# Patient Record
Sex: Male | Born: 1980 | Race: White | Hispanic: No | Marital: Married | Smoking: Never smoker
Health system: Southern US, Community
[De-identification: ages and names within clinical notes are randomized; demographics above are authoritative.]

## PROBLEM LIST (undated history)

## (undated) DIAGNOSIS — K219 Gastro-esophageal reflux disease without esophagitis: Secondary | ICD-10-CM

## (undated) HISTORY — PX: WISDOM TOOTH EXTRACTION: SHX21

---

## 2016-01-08 ENCOUNTER — Encounter (HOSPITAL_COMMUNITY): Payer: Self-pay | Admitting: *Deleted

## 2016-01-08 ENCOUNTER — Emergency Department (HOSPITAL_COMMUNITY): Payer: BLUE CROSS/BLUE SHIELD | Admitting: Anesthesiology

## 2016-01-08 ENCOUNTER — Encounter (HOSPITAL_COMMUNITY)
Admission: EM | Disposition: A | Discharge status: Home / Self Care | Payer: Self-pay | Source: Home / Self Care | Attending: Emergency Medicine

## 2016-01-08 ENCOUNTER — Ambulatory Visit (HOSPITAL_COMMUNITY)
Admission: EM | Admit: 2016-01-08 | Discharge: 2016-01-09 | Disposition: A | Discharge status: Home / Self Care | Payer: BLUE CROSS/BLUE SHIELD | Attending: Orthopedic Surgery | Admitting: Orthopedic Surgery

## 2016-01-08 ENCOUNTER — Emergency Department (HOSPITAL_COMMUNITY): Payer: BLUE CROSS/BLUE SHIELD

## 2016-01-08 DIAGNOSIS — W19XXXA Unspecified fall, initial encounter: Secondary | ICD-10-CM | POA: Insufficient documentation

## 2016-01-08 DIAGNOSIS — S82872C Displaced pilon fracture of left tibia, initial encounter for open fracture type IIIA, IIIB, or IIIC: Secondary | ICD-10-CM

## 2016-01-08 DIAGNOSIS — S82872B Displaced pilon fracture of left tibia, initial encounter for open fracture type I or II: Secondary | ICD-10-CM | POA: Insufficient documentation

## 2016-01-08 DIAGNOSIS — Y92511 Restaurant or cafe as the place of occurrence of the external cause: Secondary | ICD-10-CM | POA: Insufficient documentation

## 2016-01-08 DIAGNOSIS — Y998 Other external cause status: Secondary | ICD-10-CM | POA: Diagnosis not present

## 2016-01-08 DIAGNOSIS — R52 Pain, unspecified: Secondary | ICD-10-CM

## 2016-01-08 DIAGNOSIS — Y9302 Activity, running: Secondary | ICD-10-CM | POA: Diagnosis not present

## 2016-01-08 DIAGNOSIS — S82873A Displaced pilon fracture of unspecified tibia, initial encounter for closed fracture: Secondary | ICD-10-CM

## 2016-01-08 DIAGNOSIS — S82872A Displaced pilon fracture of left tibia, initial encounter for closed fracture: Secondary | ICD-10-CM | POA: Diagnosis present

## 2016-01-08 DIAGNOSIS — S82402B Unspecified fracture of shaft of left fibula, initial encounter for open fracture type I or II: Secondary | ICD-10-CM

## 2016-01-08 HISTORY — PX: ORIF ANKLE FRACTURE: SHX5408

## 2016-01-08 HISTORY — DX: Gastro-esophageal reflux disease without esophagitis: K21.9

## 2016-01-08 HISTORY — PX: ORIF ANKLE FRACTURE: SUR919

## 2016-01-08 LAB — BASIC METABOLIC PANEL
Anion gap: 7 (ref 5–15)
BUN: 16 mg/dL (ref 6–20)
CHLORIDE: 103 mmol/L (ref 101–111)
CO2: 28 mmol/L (ref 22–32)
Calcium: 9.6 mg/dL (ref 8.9–10.3)
Creatinine, Ser: 1.01 mg/dL (ref 0.61–1.24)
GFR calc non Af Amer: >60 mL/min (ref >60)
Glucose, Bld: 99 mg/dL (ref 65–99)
POTASSIUM: 3.8 mmol/L (ref 3.5–5.1)
SODIUM: 138 mmol/L (ref 135–145)

## 2016-01-08 LAB — CBC WITH DIFFERENTIAL/PLATELET
BASOS PCT: 0 %
Basophils Absolute: 0 10*3/uL (ref 0.0–0.1)
EOS ABS: 0.1 10*3/uL (ref 0.0–0.7)
Eosinophils Relative: 2 %
HCT: 42.2 % (ref 39.0–52.0)
HEMOGLOBIN: 14.6 g/dL (ref 13.0–17.0)
LYMPHS ABS: 2.8 10*3/uL (ref 0.7–4.0)
Lymphocytes Relative: 40 %
MCH: 30.1 pg (ref 26.0–34.0)
MCHC: 34.6 g/dL (ref 30.0–36.0)
MCV: 87 fL (ref 78.0–100.0)
Monocytes Absolute: 0.4 10*3/uL (ref 0.1–1.0)
Monocytes Relative: 6 %
NEUTROS PCT: 52 %
Neutro Abs: 3.6 10*3/uL (ref 1.7–7.7)
Platelets: 193 10*3/uL (ref 150–400)
RBC: 4.85 MIL/uL (ref 4.22–5.81)
RDW: 12.4 % (ref 11.5–15.5)
WBC: 6.9 10*3/uL (ref 4.0–10.5)

## 2016-01-08 SURGERY — OPEN REDUCTION INTERNAL FIXATION (ORIF) ANKLE FRACTURE
Anesthesia: General | Site: Ankle | Laterality: Left

## 2016-01-08 MED ORDER — HYDROMORPHONE HCL 1 MG/ML IJ SOLN: 0.2500 mg | INTRAMUSCULAR | Status: DC | PRN

## 2016-01-08 MED ORDER — PROPOFOL 10 MG/ML IV BOLUS
INTRAVENOUS | Status: DC | PRN
  Administered 2016-01-08: 200 mg via INTRAVENOUS

## 2016-01-08 MED ORDER — ASPIRIN EC 325 MG PO TBEC
325.0000 mg | DELAYED_RELEASE_TABLET | Freq: Every day | ORAL | Status: DC
  Administered 2016-01-09: 325 mg via ORAL
  Filled 2016-01-08: qty 1

## 2016-01-08 MED ORDER — METOCLOPRAMIDE HCL 5 MG/ML IJ SOLN: 5.0000 mg | Freq: Three times a day (TID) | INTRAMUSCULAR | Status: DC | PRN

## 2016-01-08 MED ORDER — LACTATED RINGERS IV SOLN
INTRAVENOUS | Status: DC | PRN
  Administered 2016-01-08 (×2): via INTRAVENOUS

## 2016-01-08 MED ORDER — PROPOFOL 10 MG/ML IV BOLUS
INTRAVENOUS | Status: AC
  Filled 2016-01-08: qty 20

## 2016-01-08 MED ORDER — SUCCINYLCHOLINE CHLORIDE 200 MG/10ML IV SOSY
PREFILLED_SYRINGE | INTRAVENOUS | Status: AC
  Filled 2016-01-08: qty 10

## 2016-01-08 MED ORDER — ACETAMINOPHEN 650 MG RE SUPP: 650.0000 mg | Freq: Four times a day (QID) | RECTAL | Status: DC | PRN

## 2016-01-08 MED ORDER — ONDANSETRON HCL 4 MG/2ML IJ SOLN
INTRAMUSCULAR | Status: DC | PRN
  Administered 2016-01-08: 4 mg via INTRAVENOUS

## 2016-01-08 MED ORDER — SODIUM CHLORIDE 0.9 % IR SOLN
Status: DC | PRN
  Administered 2016-01-08: 3000 mL

## 2016-01-08 MED ORDER — METHOCARBAMOL 500 MG PO TABS: 500.0000 mg | ORAL_TABLET | Freq: Four times a day (QID) | ORAL | Status: DC | PRN

## 2016-01-08 MED ORDER — FENTANYL CITRATE (PF) 100 MCG/2ML IJ SOLN
50.0000 ug | Freq: Once | INTRAMUSCULAR | Status: AC
  Administered 2016-01-08: 50 ug via INTRAVENOUS

## 2016-01-08 MED ORDER — ACETAMINOPHEN 325 MG PO TABS
650.0000 mg | ORAL_TABLET | Freq: Four times a day (QID) | ORAL | Status: DC | PRN
  Administered 2016-01-09: 650 mg via ORAL
  Filled 2016-01-08: qty 2

## 2016-01-08 MED ORDER — SUGAMMADEX SODIUM 200 MG/2ML IV SOLN
INTRAVENOUS | Status: AC
  Filled 2016-01-08: qty 2

## 2016-01-08 MED ORDER — HYDROMORPHONE HCL 1 MG/ML IJ SOLN
1.0000 mg | Freq: Once | INTRAMUSCULAR | Status: AC
  Administered 2016-01-08: 1 mg via INTRAVENOUS
  Filled 2016-01-08: qty 1

## 2016-01-08 MED ORDER — ONDANSETRON HCL 4 MG PO TABS: 4.0000 mg | ORAL_TABLET | Freq: Four times a day (QID) | ORAL | Status: DC | PRN

## 2016-01-08 MED ORDER — METOCLOPRAMIDE HCL 5 MG PO TABS: 5.0000 mg | ORAL_TABLET | Freq: Three times a day (TID) | ORAL | Status: DC | PRN

## 2016-01-08 MED ORDER — ONDANSETRON HCL 4 MG/2ML IJ SOLN: 4.0000 mg | Freq: Four times a day (QID) | INTRAMUSCULAR | Status: DC | PRN

## 2016-01-08 MED ORDER — MIDAZOLAM HCL 2 MG/2ML IJ SOLN
INTRAMUSCULAR | Status: AC
  Filled 2016-01-08: qty 2

## 2016-01-08 MED ORDER — KETOROLAC TROMETHAMINE 30 MG/ML IJ SOLN
INTRAMUSCULAR | Status: AC
  Filled 2016-01-08: qty 1

## 2016-01-08 MED ORDER — LIDOCAINE 2% (20 MG/ML) 5 ML SYRINGE
INTRAMUSCULAR | Status: AC
  Filled 2016-01-08: qty 5

## 2016-01-08 MED ORDER — SODIUM CHLORIDE 0.9 % IJ SOLN
INTRAMUSCULAR | Status: AC
Start: 2016-01-08 — End: 2016-01-08
  Filled 2016-01-08: qty 10

## 2016-01-08 MED ORDER — KETOROLAC TROMETHAMINE 15 MG/ML IJ SOLN
15.0000 mg | Freq: Four times a day (QID) | INTRAMUSCULAR | Status: DC
  Administered 2016-01-09 (×3): 15 mg via INTRAVENOUS
  Filled 2016-01-08 (×3): qty 1

## 2016-01-08 MED ORDER — ROCURONIUM BROMIDE 100 MG/10ML IV SOLN
INTRAVENOUS | Status: DC | PRN
  Administered 2016-01-08: 25 mg via INTRAVENOUS

## 2016-01-08 MED ORDER — METHOCARBAMOL 1000 MG/10ML IJ SOLN: 500.0000 mg | Freq: Four times a day (QID) | INTRAVENOUS | Status: DC | PRN

## 2016-01-08 MED ORDER — TETANUS-DIPHTH-ACELL PERTUSSIS 5-2.5-18.5 LF-MCG/0.5 IM SUSP
0.5000 mL | Freq: Once | INTRAMUSCULAR | Status: AC
  Administered 2016-01-08: 0.5 mL via INTRAMUSCULAR
  Filled 2016-01-08: qty 0.5

## 2016-01-08 MED ORDER — FENTANYL CITRATE (PF) 100 MCG/2ML IJ SOLN
INTRAMUSCULAR | Status: AC
  Filled 2016-01-08: qty 2

## 2016-01-08 MED ORDER — SUGAMMADEX SODIUM 200 MG/2ML IV SOLN
INTRAVENOUS | Status: DC | PRN
  Administered 2016-01-08: 200 mg via INTRAVENOUS

## 2016-01-08 MED ORDER — SUFENTANIL CITRATE 50 MCG/ML IV SOLN
INTRAVENOUS | Status: AC
  Filled 2016-01-08: qty 1

## 2016-01-08 MED ORDER — MIDAZOLAM HCL 2 MG/2ML IJ SOLN
INTRAMUSCULAR | Status: DC | PRN
  Administered 2016-01-08: 2 mg via INTRAVENOUS

## 2016-01-08 MED ORDER — LIDOCAINE HCL (CARDIAC) 20 MG/ML IV SOLN
INTRAVENOUS | Status: DC | PRN
  Administered 2016-01-08: 60 mg via INTRATRACHEAL

## 2016-01-08 MED ORDER — KETOROLAC TROMETHAMINE 30 MG/ML IJ SOLN
INTRAMUSCULAR | Status: DC | PRN
  Administered 2016-01-08: 30 mg via INTRAVENOUS

## 2016-01-08 MED ORDER — CEFAZOLIN IN D5W 1 GM/50ML IV SOLN
INTRAVENOUS | Status: AC
  Administered 2016-01-08: 1 g via INTRAVENOUS
  Filled 2016-01-08: qty 50

## 2016-01-08 MED ORDER — TETANUS-DIPHTHERIA TOXOIDS TD 5-2 LFU IM INJ: 0.5000 mL | INJECTION | Freq: Once | INTRAMUSCULAR | Status: DC

## 2016-01-08 MED ORDER — ACETAMINOPHEN 10 MG/ML IV SOLN
INTRAVENOUS | Status: AC
  Filled 2016-01-08: qty 100

## 2016-01-08 MED ORDER — CEFAZOLIN SODIUM-DEXTROSE 2-4 GM/100ML-% IV SOLN
2.0000 g | Freq: Once | INTRAVENOUS | Status: AC
  Administered 2016-01-08: 2 g via INTRAVENOUS
  Filled 2016-01-08: qty 100

## 2016-01-08 MED ORDER — ONDANSETRON HCL 4 MG/2ML IJ SOLN
INTRAMUSCULAR | Status: AC
  Filled 2016-01-08: qty 2

## 2016-01-08 MED ORDER — SUFENTANIL CITRATE 50 MCG/ML IV SOLN
INTRAVENOUS | Status: DC | PRN
  Administered 2016-01-08 (×3): 5 ug via INTRAVENOUS
  Administered 2016-01-08: 15 ug via INTRAVENOUS

## 2016-01-08 MED ORDER — ACETAMINOPHEN 10 MG/ML IV SOLN
INTRAVENOUS | Status: DC | PRN
Start: 2016-01-08 — End: 2016-01-08
  Administered 2016-01-08: 1000 mg via INTRAVENOUS

## 2016-01-08 MED ORDER — OXYCODONE HCL 5 MG PO TABS: 5.0000 mg | ORAL_TABLET | ORAL | Status: DC | PRN

## 2016-01-08 MED ORDER — ROCURONIUM BROMIDE 50 MG/5ML IV SOLN
INTRAVENOUS | Status: AC
Start: 2016-01-08 — End: 2016-01-08
  Filled 2016-01-08: qty 1

## 2016-01-08 MED ORDER — 0.9 % SODIUM CHLORIDE (POUR BTL) OPTIME
TOPICAL | Status: DC | PRN
  Administered 2016-01-08: 1000 mL

## 2016-01-08 MED ORDER — HYDROMORPHONE HCL 1 MG/ML IJ SOLN: 1.0000 mg | INTRAMUSCULAR | Status: DC | PRN

## 2016-01-08 MED ORDER — SUCCINYLCHOLINE CHLORIDE 20 MG/ML IJ SOLN
INTRAMUSCULAR | Status: DC | PRN
  Administered 2016-01-08: 120 mg via INTRAVENOUS

## 2016-01-08 MED ORDER — SODIUM CHLORIDE 0.9 % IV SOLN: INTRAVENOUS | Status: DC

## 2016-01-08 MED ORDER — CEFAZOLIN IN D5W 1 GM/50ML IV SOLN
1.0000 g | Freq: Four times a day (QID) | INTRAVENOUS | Status: AC
  Administered 2016-01-09 (×3): 1 g via INTRAVENOUS
  Filled 2016-01-08 (×3): qty 50

## 2016-01-08 SURGICAL SUPPLY — 52 items
BANDAGE ESMARK 6X9 LF (GAUZE/BANDAGES/DRESSINGS) ×1 IMPLANT
BIT DRILL LCP QC 2X140 (BIT) ×3 IMPLANT
BNDG COHESIVE 4X5 TAN STRL (GAUZE/BANDAGES/DRESSINGS) ×3 IMPLANT
BNDG ESMARK 6X9 LF (GAUZE/BANDAGES/DRESSINGS) ×3
BNDG GAUZE ELAST 4 BULKY (GAUZE/BANDAGES/DRESSINGS) IMPLANT
CANISTER WOUND CARE 500ML ATS (WOUND CARE) ×3 IMPLANT
COVER SURGICAL LIGHT HANDLE (MISCELLANEOUS) ×6 IMPLANT
CUFF TOURNIQUET SINGLE 34IN LL (TOURNIQUET CUFF) IMPLANT
CUFF TOURNIQUET SINGLE 44IN (TOURNIQUET CUFF) IMPLANT
DRAPE INCISE IOBAN 66X45 STRL (DRAPES) ×3 IMPLANT
DRAPE OEC MINIVIEW 54X84 (DRAPES) ×3 IMPLANT
DRAPE PROXIMA HALF (DRAPES) ×3 IMPLANT
DRAPE U-SHAPE 47X51 STRL (DRAPES) ×3 IMPLANT
DRSG ADAPTIC 3X8 NADH LF (GAUZE/BANDAGES/DRESSINGS) ×3 IMPLANT
DRSG PAD ABDOMINAL 8X10 ST (GAUZE/BANDAGES/DRESSINGS) ×3 IMPLANT
DURAPREP 26ML APPLICATOR (WOUND CARE) ×3 IMPLANT
ELECT REM PT RETURN 9FT ADLT (ELECTROSURGICAL) ×3
ELECTRODE REM PT RTRN 9FT ADLT (ELECTROSURGICAL) ×1 IMPLANT
GAUZE SPONGE 4X4 12PLY STRL (GAUZE/BANDAGES/DRESSINGS) ×3 IMPLANT
GLOVE BIOGEL PI IND STRL 9 (GLOVE) ×1 IMPLANT
GLOVE BIOGEL PI INDICATOR 9 (GLOVE) ×2
GLOVE SURG ORTHO 9.0 STRL STRW (GLOVE) ×3 IMPLANT
GOWN STRL REUS W/ TWL XL LVL3 (GOWN DISPOSABLE) ×3 IMPLANT
GOWN STRL REUS W/TWL XL LVL3 (GOWN DISPOSABLE) ×6
HANDPIECE INTERPULSE COAX TIP (DISPOSABLE) ×2
KIT BASIN OR (CUSTOM PROCEDURE TRAY) ×3 IMPLANT
KIT ROOM TURNOVER OR (KITS) ×3 IMPLANT
MANIFOLD NEPTUNE II (INSTRUMENTS) ×3 IMPLANT
NS IRRIG 1000ML POUR BTL (IV SOLUTION) ×3 IMPLANT
PACK ORTHO EXTREMITY (CUSTOM PROCEDURE TRAY) ×3 IMPLANT
PAD ARMBOARD 7.5X6 YLW CONV (MISCELLANEOUS) ×6 IMPLANT
PLATE DIST TIBIA 6H 2.7/3.5MM (Plate) ×3 IMPLANT
PREVENA INCISION MGT 90 150 (MISCELLANEOUS) ×3 IMPLANT
SCREW CORTEX LOW PRO 3.5X26 (Screw) ×3 IMPLANT
SCREW CORTEX LOW PRO 3.5X28 (Screw) ×6 IMPLANT
SCREW CORTEX LP 3.5X34MM (Screw) ×3 IMPLANT
SCREW LOCKING 2.7X44MM VA (Screw) ×6 IMPLANT
SCREW LOCKING VA 2.7X34MM (Screw) ×3 IMPLANT
SCREW METAPHYSEAL 2.7X36 (Screw) ×3 IMPLANT
SET HNDPC FAN SPRY TIP SCT (DISPOSABLE) ×1 IMPLANT
SPONGE LAP 18X18 X RAY DECT (DISPOSABLE) ×3 IMPLANT
STAPLER VISISTAT 35W (STAPLE) IMPLANT
SUCTION FRAZIER HANDLE 10FR (MISCELLANEOUS) ×2
SUCTION TUBE FRAZIER 10FR DISP (MISCELLANEOUS) ×1 IMPLANT
SUT ETHILON 2 0 PSLX (SUTURE) ×6 IMPLANT
SUT VIC AB 2-0 CT1 27 (SUTURE)
SUT VIC AB 2-0 CT1 TAPERPNT 27 (SUTURE) IMPLANT
TOWEL OR 17X24 6PK STRL BLUE (TOWEL DISPOSABLE) ×3 IMPLANT
TOWEL OR 17X26 10 PK STRL BLUE (TOWEL DISPOSABLE) ×3 IMPLANT
TUBE CONNECTING 12'X1/4 (SUCTIONS) ×1
TUBE CONNECTING 12X1/4 (SUCTIONS) ×2 IMPLANT
YANKAUER SUCT BULB TIP NO VENT (SUCTIONS) ×3 IMPLANT

## 2016-01-08 NOTE — Op Note (Signed)
01/08/2016  9:04 PM  PATIENT:  Shane Sweeney    PRE-OPERATIVE DIAGNOSIS:  Open left  pilon fracture with associated fibular fracture  POST-OPERATIVE DIAGNOSIS:  Same  PROCEDURE:  OPEN REDUCTION INTERNAL FIXATION (ORIF) ANKLE FRACTURE Irrigation and debridement of skin soft tissue and bone with removal of loose bone fragments. Application of Prevena wound VAC.  SURGEON:  Nadara MustardUDA,MARCUS V, MD  PHYSICIAN ASSISTANT:None ANESTHESIA:   General  PREOPERATIVE INDICATIONS:  Shane Sweeney is a  35 y.o. male with a diagnosis of Open left  pilon fracture with associated fibular fracture who failed conservative measures and elected for surgical management.    The risks benefits and alternatives were discussed with the patient preoperatively including but not limited to the risks of infection, bleeding, nerve injury, cardiopulmonary complications, the need for revision surgery, among others, and the patient was willing to proceed.  OPERATIVE IMPLANTS: 6 hole anterior lateral Synthes plate  OPERATIVE FINDINGS: Comminution of the fibular fracture  OPERATIVE PROCEDURE: Patient was brought to the operating room and underwent a general anesthetic. After adequate levels anesthesia obtained patient's left lower extremity was prepped using DuraPrep draped into a sterile field a timeout was called. An anterior lateral incision was made and blunt dissection was carried down to the fascia of the superficial peroneal nerve was identified and retracted laterally. The extensor muscles were also retracted laterally. The fracture site was debrided of skin and soft tissue the fibula had significant comminution and this was irrigated with pulsatile lavage for 3 L and loose bony fragments were removed. The fibula was in essentially 4 large fragments the proximal medial medial fragment was reduced and stabilized with a clamp on the distal 2 fragments were reduced and an anterior lateral plate was placed provisionally a cart K  wire was used to stabilize the alignment and C-arm fluoroscopy verified alignment both AP and lateral planes with restoration of congruence of the joint. Compression screws were placed proximally 3 and compression followed by locking screws were placed 4 distally. C-arm fluoroscopy verified alignment one of the compression screws was placed obliquely through the medial fragment of the shaft to stabilize this fragment. The wound was irrigated with pulsatile lavage. There was significant destruction of the extensor muscles and the nonviable muscle tissue was excised. After reduction of the mortise was congruent in both AP lateral and oblique planes. The fibula lined up anatomically after reduction of the tibia and with the comminution and the open wound over the fibula was elected not to put a plate that this would not increase the risk for infection and would not stabilize the construct further. The incision was closed using 2-0 nylon. A Prevena wound VAC was applied this had a good suction fit patient was extubated taken to the PACU in stable condition.

## 2016-01-08 NOTE — ED Notes (Signed)
Pt arrives POV with wife.  Helped him out of car.  Obvious deformity of LLE, open ankle fracture, pt is bleeding.  Taken to room by NT

## 2016-01-08 NOTE — ED Notes (Signed)
Pt states he jumped over a bush trying to return a purse and states he broke his right ankle.

## 2016-01-08 NOTE — H&P (Signed)
Shane Sweeney is an 35 y.o. male.   Chief Complaint: Open left ankle pilon fracture HPI: Shane Sweeney is a 35 year old gentleman who was at [facility] somebody left an item behind he was running and jumped over ablation to try to get to the individual and he landed sustaining a intra-articular fracture of the distal tibia and distal fibula open fracture. Shane Sweeney denies any other trauma denies any head injury.  History reviewed. No pertinent past medical history.  Past Surgical History  Procedure Laterality Date  . Wisdom tooth extraction      No family history on file. Social History:  reports that he has never smoked. He does not have any smokeless tobacco history on file. He reports that he does not drink alcohol or use illicit drugs.  Allergies: No Known Allergies   (Not in a hospital admission)  Results for orders placed or performed during the hospital encounter of 01/08/16 (from the past 48 hour(s))  CBC with Differential     Status: None   Collection Time: 01/08/16  4:00 PM  Result Value Ref Range   WBC 6.9 4.0 - 10.5 K/uL   RBC 4.85 4.22 - 5.81 MIL/uL   Hemoglobin 14.6 13.0 - 17.0 g/dL   HCT 42.2 39.0 - 52.0 %   MCV 87.0 78.0 - 100.0 fL   MCH 30.1 26.0 - 34.0 pg   MCHC 34.6 30.0 - 36.0 g/dL   RDW 12.4 11.5 - 15.5 %   Platelets 193 150 - 400 K/uL   Neutrophils Relative % 52 %   Neutro Abs 3.6 1.7 - 7.7 K/uL   Lymphocytes Relative 40 %   Lymphs Abs 2.8 0.7 - 4.0 K/uL   Monocytes Relative 6 %   Monocytes Absolute 0.4 0.1 - 1.0 K/uL   Eosinophils Relative 2 %   Eosinophils Absolute 0.1 0.0 - 0.7 K/uL   Basophils Relative 0 %   Basophils Absolute 0.0 0.0 - 0.1 K/uL  Basic metabolic panel     Status: None   Collection Time: 01/08/16  4:00 PM  Result Value Ref Range   Sodium 138 135 - 145 mmol/L   Potassium 3.8 3.5 - 5.1 mmol/L   Chloride 103 101 - 111 mmol/L   CO2 28 22 - 32 mmol/L   Glucose, Bld 99 65 - 99 mg/dL   BUN 16 6 - 20 mg/dL   Creatinine, Ser 1.01 0.61 - 1.24  mg/dL   Calcium 9.6 8.9 - 10.3 mg/dL   GFR calc non Af Amer >60 >60 mL/min   GFR calc Af Amer >60 >60 mL/min    Comment: (NOTE) The eGFR has been calculated using the CKD EPI equation. This calculation has not been validated in all clinical situations. eGFR's persistently <60 mL/min signify possible Chronic Kidney Disease.    Anion gap 7 5 - 15   Dg Tibia/fibula Left  01/08/2016  CLINICAL DATA:  Left lower extremity injury EXAM: LEFT TIBIA AND FIBULA - 2 VIEW COMPARISON:  None. FINDINGS: There is a mildly comminuted non articular distal left fibular shaft fracture with apex lateral angulation and minimal 3 mm posterior displacement of the dominant distal fracture fragment. There is a comminuted intra-articular left distal tibia fracture with apex lateral angulation, mild overriding and 5 mm posterior displacement of the distal fracture fragments. No subluxation is seen at the left knee or left ankle on these views. No suspicious focal osseous lesions. Paper clip overlying the posterior soft tissues at the level of the left distal femur on  the lateral view is not seen on the frontal view and is probably external to the Shane Sweeney. IMPRESSION: 1. Minimally displaced comminuted angulated left distal fibula shaft fracture. 2. Comminuted mildly displaced angulated intra-articular left distal tibia fracture as described. Electronically Signed   By: Ilona Sorrel M.D.   On: 01/08/2016 16:59   Dg Ankle Left Port  01/08/2016  CLINICAL DATA:  Jumping injury.  Open fracture with deformity. EXAM: PORTABLE LEFT ANKLE - 2 VIEW COMPARISON:  None. FINDINGS: There is complete transverse fracture of the distal fibular diaphysis 9 cm proximal to the end of the bone. There is lateral angulation measuring 20 degrees. Very minimal ventral angulation. There is a comminuted fracture of the distal tibial diaphysis and metaphysis with lateral angulation also of 20 degrees. Mild ventral angulation also demonstrated. Fracture line  extends to the articular surface. Fragments are displaced approximately 4 mm. No fracture of the talus. IMPRESSION: Transverse fracture of the distal fibular diaphysis angulated laterally 20 degrees. Comminuted fracture of the distal tibia angulated laterally 20 degrees. Fracture lines extend to the articular surface, with fragment displacement of the anterior tibial articular surface of 4 mm. Electronically Signed   By: Nelson Chimes M.D.   On: 01/08/2016 16:57   Dg Foot 2 Views Left  01/08/2016  CLINICAL DATA:  Pain following fall EXAM: LEFT FOOT - 2 VIEW COMPARISON:  Left ankle January 08, 2016 FINDINGS: Frontal and lateral views were obtained. There is a comminuted fracture of the distal tibia. There is a fracture of the distal fibula, better seen on ankle images. In the foot region, a portion of the posterior calcaneus is not visualized due to difficulties with positioning. In the visualized foot regions, there is no fracture or dislocation. The joint spaces in the foot region appear normal. No erosive change. IMPRESSION: Comminuted fracture distal tibia. Fracture distal fibula. Note that a portion of the posterior calcaneus is not image. Visualized images of the foot region show no fracture or dislocation. No appreciable arthropathic change. Electronically Signed   By: Lowella Grip III M.D.   On: 01/08/2016 16:59    Review of Systems  All other systems reviewed and are negative.   Blood pressure 129/87, pulse 62, temperature 98.3 F (36.8 C), temperature source Oral, resp. rate 20, height _0  (1.778 m), weight 74.39 kg (164 lb), SpO2 100 %. Physical Exam  On examination Shane Sweeney has varus alignment to the ankle. He has a good dorsalis pedis pulse there is bleeding from the open fibular fracture. Review of the radiographs shows a intra-articular pilon fracture of the right distal tibia with associated fibular fracture which is open grade 3A.  Shane Sweeney is alert oriented no adenopathy well-dressed  normal affect normal respiratory effort. Shane Sweeney is neurovascularly intact in the left lower extremity. Normal mood and normal affect.  Radiographs are reviewed which shows an intra-articular pilon fracture with associated fibular fracture. Assessment/Plan Assessment: Open left pilon fracture with associated fibular fracture.  Plan: Shane Sweeney has received IV kefzol in the emergency room we will attempt emergent internal fixation. Risk and benefits were discussed including infection neurovascular injury arthritis ankle pain and need for additional surgery risk for DVT. Shane Sweeney states he understands wishes to proceed this time Shane Sweeney is from [facility] and will need to follow up in [facility] postoperatively.  Newt Minion, MD 01/08/2016, 5:38 PM

## 2016-01-08 NOTE — ED Provider Notes (Signed)
CSN: [id number]     Arrival date & time 01/08/16  1554 History   First MD Initiated Contact with Patient 01/08/16 1600     Chief Complaint  Patient presents with  . Fall  . Ankle Injury   (Consider location/radiation/quality/duration/timing/severity/associated sxs/prior Treatment) Patient is a 35 y.o. male presenting with lower extremity injury and ankle pain. The history is provided by the patient.  Ankle Injury Pertinent negatives include no abdominal pain, chest pain, chills, congestion, coughing, fever, nausea, neck pain, rash, sore throat or vomiting.  Ankle Pain Location:  Ankle Time since incident:  20 minutes Ankle location:  L ankle Pain details:    Quality:  Aching   Radiates to:  Does not radiate   Severity:  Severe   Onset quality:  Sudden   Timing:  Constant   Progression:  Unchanged Chronicity:  New Dislocation: yes   Tetanus status:  Out of date Prior injury to area:  No Relieved by:  None tried Associated symptoms: no back pain, no fever and no neck pain     History reviewed. No pertinent past medical history. Past Surgical History  Procedure Laterality Date  . Wisdom tooth extraction     No family history on file. Social History  Substance Use Topics  . Smoking status: Never Smoker   . Smokeless tobacco: None  . Alcohol Use: No    Review of Systems  Constitutional: Negative for fever and chills.  HENT: Negative for congestion and sore throat.   Eyes: Negative for pain.  Respiratory: Negative for cough and shortness of breath.   Cardiovascular: Negative for chest pain and palpitations.  Gastrointestinal: Negative for nausea, vomiting, abdominal pain and diarrhea.  Endocrine: Negative.   Genitourinary: Negative for flank pain.  Musculoskeletal: Negative for back pain and neck pain.       Left ankle pain  Skin: Negative for rash.       Wound to outer left ankle  Allergic/Immunologic: Negative.   Neurological: Negative for dizziness, syncope and  light-headedness.  Psychiatric/Behavioral: Negative for confusion.      Allergies  Review of patient's allergies indicates no known allergies.  Home Medications   Prior to Admission medications   Not on File   BP 116/62 mmHg  Pulse 70  Temp(Src) 98.4 F (36.9 C) (Oral)  Resp 14  Ht 5\' 10"  (1.778 m)  Wt 74.39 kg  BMI 23.53 kg/m2  SpO2 99% Physical Exam  Constitutional: He is oriented to person, place, and time. He appears well-developed and well-nourished.  HENT:  Head: Normocephalic and atraumatic.  Eyes: Conjunctivae and EOM are normal. Pupils are equal, round, and reactive to light.  Neck: Normal range of motion. Neck supple.  Cardiovascular: Normal rate, regular rhythm, normal heart sounds and intact distal pulses.   Pulses:      Dorsalis pedis pulses are 2+ on the left side.       Posterior tibial pulses are 2+ on the left side.  Normal cap refill of left toes  Pulmonary/Chest: Effort normal and breath sounds normal. No respiratory distress.  Abdominal: Soft. Bowel sounds are normal. There is no tenderness.  Musculoskeletal:       Left knee: Normal.       Left ankle: He exhibits decreased range of motion, swelling, deformity and laceration. He exhibits normal pulse. Tenderness. Lateral malleolus tenderness found. No medial malleolus tenderness found.       Left lower leg: He exhibits swelling and deformity.  Left foot: Normal.       Feet:  Neurological: He is alert and oriented to person, place, and time. He has normal reflexes. No cranial nerve deficit.  Skin: Skin is warm and dry.    ED Course  Procedures (including critical care time) Labs Review Labs Reviewed  CBC WITH DIFFERENTIAL/PLATELET  BASIC METABOLIC PANEL    Imaging Review Dg Tibia/fibula Left  01/08/2016  CLINICAL DATA:  Left lower extremity injury EXAM: LEFT TIBIA AND FIBULA - 2 VIEW COMPARISON:  None. FINDINGS: There is a mildly comminuted non articular distal left fibular shaft fracture  with apex lateral angulation and minimal 3 mm posterior displacement of the dominant distal fracture fragment. There is a comminuted intra-articular left distal tibia fracture with apex lateral angulation, mild overriding and 5 mm posterior displacement of the distal fracture fragments. No subluxation is seen at the left knee or left ankle on these views. No suspicious focal osseous lesions. Paper clip overlying the posterior soft tissues at the level of the left distal femur on the lateral view is not seen on the frontal view and is probably external to the patient. IMPRESSION: 1. Minimally displaced comminuted angulated left distal fibula shaft fracture. 2. Comminuted mildly displaced angulated intra-articular left distal tibia fracture as described. Electronically Signed   By: Delbert PhenixJason A Poff M.D.   On: 01/08/2016 16:59   Dg Ankle Left Port  01/08/2016  CLINICAL DATA:  Jumping injury.  Open fracture with deformity. EXAM: PORTABLE LEFT ANKLE - 2 VIEW COMPARISON:  None. FINDINGS: There is complete transverse fracture of the distal fibular diaphysis 9 cm proximal to the end of the bone. There is lateral angulation measuring 20 degrees. Very minimal ventral angulation. There is a comminuted fracture of the distal tibial diaphysis and metaphysis with lateral angulation also of 20 degrees. Mild ventral angulation also demonstrated. Fracture line extends to the articular surface. Fragments are displaced approximately 4 mm. No fracture of the talus. IMPRESSION: Transverse fracture of the distal fibular diaphysis angulated laterally 20 degrees. Comminuted fracture of the distal tibia angulated laterally 20 degrees. Fracture lines extend to the articular surface, with fragment displacement of the anterior tibial articular surface of 4 mm. Electronically Signed   By: Paulina FusiMark  Shogry M.D.   On: 01/08/2016 16:57   Dg Foot 2 Views Left  01/08/2016  CLINICAL DATA:  Pain following fall EXAM: LEFT FOOT - 2 VIEW COMPARISON:  Left  ankle January 08, 2016 FINDINGS: Frontal and lateral views were obtained. There is a comminuted fracture of the distal tibia. There is a fracture of the distal fibula, better seen on ankle images. In the foot region, a portion of the posterior calcaneus is not visualized due to difficulties with positioning. In the visualized foot regions, there is no fracture or dislocation. The joint spaces in the foot region appear normal. No erosive change. IMPRESSION: Comminuted fracture distal tibia. Fracture distal fibula. Note that a portion of the posterior calcaneus is not image. Visualized images of the foot region show no fracture or dislocation. No appreciable arthropathic change. Electronically Signed   By: Bretta BangWilliam  Woodruff III M.D.   On: 01/08/2016 16:59   I have personally reviewed and evaluated these images and lab results as part of my medical decision-making.   EKG Interpretation None      MDM   Final diagnoses:  Pilon fracture of tibia  Fibula fracture, left, open type I or II, initial encounter    The pt is a previously healthy 35 yo  male who jumped over a bush earlier in the evening when attempting to return a stranger her purse she left at [employer].  He misjudged the height of the curb however and twisted his left ankle with immediate pain and deformity.   On exam pt is HDS in NAD.  Apparent deformity of left ankle with likely open fx.  LLE neurovascularly intact.  Tetanus and abx given.  XRs displayed of left pilon fx.   Ortho consulted and evaluated the pt in the ED with plans to take to OR.   Labs and images were viewed by myself and incorporated into medical decision making.  Discussed pertinent finding with patient or caregiver prior to admission with no further questions.  Pt care supervised by my attending Dr. Karma Ganja.   Tery Sanfilippo, MD PGY-3 Emergency Medicine     Tery Sanfilippo, MD 01/09/16 1610  Jerelyn Scott, MD 01/09/16 [phone]

## 2016-01-08 NOTE — Anesthesia Postprocedure Evaluation (Signed)
Anesthesia Post Note  Patient: Shane StandardMark Straus  Procedure(s) Performed: Procedure(s) (LRB): OPEN REDUCTION INTERNAL FIXATION (ORIF) ANKLE FRACTURE (Left)  Patient location during evaluation: PACU Anesthesia Type: General Level of consciousness: awake and alert Pain management: pain level controlled Vital Signs Assessment: post-procedure vital signs reviewed and stable Respiratory status: spontaneous breathing, nonlabored ventilation, respiratory function stable and patient connected to nasal cannula oxygen Cardiovascular status: blood pressure returned to baseline and stable Postop Assessment: no signs of nausea or vomiting Anesthetic complications: no    Last Vitals:  Filed Vitals:   01/08/16 2208 01/08/16 2233  BP: 119/63 120/68  Pulse: 72 74  Temp: 37 C 37.4 C  Resp: 12 10    Last Pain:  Filed Vitals:   01/08/16 2235  PainSc: 4                  Rashawd Laskaris,W. EDMOND

## 2016-01-08 NOTE — Anesthesia Preprocedure Evaluation (Signed)
Anesthesia Evaluation  Patient identified by MRN, date of birth, ID band Patient awake    Reviewed: Allergy & Precautions, H&P , NPO status , Patient's Chart, lab work & pertinent test results  Airway Mallampati: II  TM Distance: >3 FB Neck ROM: Full    Dental no notable dental hx. (+) Teeth Intact, Dental Advisory Given   Pulmonary neg pulmonary ROS,    Pulmonary exam normal breath sounds clear to auscultation       Cardiovascular negative cardio ROS   Rhythm:Regular Rate:Normal     Neuro/Psych negative neurological ROS  negative psych ROS   GI/Hepatic negative GI ROS, Neg liver ROS,   Endo/Other  negative endocrine ROS  Renal/GU negative Renal ROS  negative genitourinary   Musculoskeletal   Abdominal   Peds  Hematology negative hematology ROS (+)   Anesthesia Other Findings   Reproductive/Obstetrics negative OB ROS                            Anesthesia Physical Anesthesia Plan  ASA: I and emergent  Anesthesia Plan: General   Post-op Pain Management:    Induction: Intravenous, Rapid sequence and Cricoid pressure planned  Airway Management Planned: Oral ETT  Additional Equipment:   Intra-op Plan:   Post-operative Plan: Extubation in OR  Informed Consent: I have reviewed the patients History and Physical, chart, labs and discussed the procedure including the risks, benefits and alternatives for the proposed anesthesia with the patient or authorized representative who has indicated his/her understanding and acceptance.   Dental advisory given  Plan Discussed with: CRNA  Anesthesia Plan Comments:         Anesthesia Quick Evaluation  

## 2016-01-08 NOTE — Anesthesia Procedure Notes (Signed)
Procedure Name: Intubation Date/Time: 01/08/2016 7:47 PM Performed by: [place] Liz S Pre-anesthesia Checklist: Patient identified, Emergency Drugs available, Suction available, Patient being monitored and Timeout performed Patient Re-evaluated:Patient Re-evaluated prior to inductionOxygen Delivery Method: Circle system utilized Preoxygenation: Pre-oxygenation with 100% oxygen Intubation Type: IV induction, Rapid sequence and Cricoid Pressure applied Laryngoscope Size: Mac and 4 Grade View: Grade I Tube type: Oral Tube size: 8.0 mm Number of attempts: 1 Airway Equipment and Method: Stylet Placement Confirmation: ETT inserted through vocal cords under direct vision,  positive ETCO2 and breath sounds checked- equal and bilateral Secured at: 23 cm Tube secured with: Tape Dental Injury: Teeth and Oropharynx as per pre-operative assessment

## 2016-01-08 NOTE — Transfer of Care (Signed)
Immediate Anesthesia Transfer of Care Note  Patient: Shane Sweeney  Procedure(s) Performed: Procedure(s): OPEN REDUCTION INTERNAL FIXATION (ORIF) ANKLE FRACTURE (Left)  Patient Location: PACU  Anesthesia Type:General  Level of Consciousness: awake  Airway & Oxygen Therapy: Patient Spontanous Breathing and Patient connected to nasal cannula oxygen  Post-op Assessment: Report given to RN and Post -op Vital signs reviewed and stable  Post vital signs: Reviewed and stable  Last Vitals:  Filed Vitals:   01/08/16 1830 01/08/16 1845  BP: 144/94 132/86  Pulse: 79 76  Temp:    Resp:      Last Pain:  Filed Vitals:   01/08/16 1856  PainSc: 8          Complications: No apparent anesthesia complications

## 2016-01-09 ENCOUNTER — Encounter (HOSPITAL_COMMUNITY): Payer: Self-pay | Admitting: General Practice

## 2016-01-09 MED ORDER — ASPIRIN EC 325 MG PO TBEC: 325.0000 mg | DELAYED_RELEASE_TABLET | Freq: Every day | ORAL | Status: AC

## 2016-01-09 MED ORDER — OXYCODONE-ACETAMINOPHEN 5-325 MG PO TABS: 1.0000 | ORAL_TABLET | ORAL | Status: AC | PRN

## 2016-01-09 NOTE — Evaluation (Signed)
Physical Therapy Evaluation Patient Details Name: Shane Sweeney MRN: [id number] DOB: [birth year] Today's Date: 01/09/2016   History of Present Illness  35 y/o male sustained open tib/fib fracture, S/P ORIF with wound VAC application  Clinical Impression  The patient admitted with above diagnosis. The patient  tolerated ambulation with crutches and practiced steps with wife present. The patient wishes to DC today. No further PT indicated.    Follow Up Recommendations No PT follow up;Supervision - Intermittent    Equipment Recommendations  Crutches    Recommendations for Other Services       Precautions / Restrictions Precautions Precautions: Fall Required Braces or Orthoses: Other Brace/Splint Other Brace/Splint: CAM boot, instructed patient and wife on application and that orders are to wear the boot when up moving around. Restrictions LLE Weight Bearing: Non weight bearing      Mobility  Bed Mobility Overal bed mobility: Needs Assistance Bed Mobility: Supine to Sit;Sit to Supine     Supine to sit: Min assist Sit to supine: Supervision   General bed mobility comments: assist with the L eft leg, assist with the VAC, able to place the left leg onto the bed independently  Transfers Overall transfer level: Needs assistance Equipment used: Rolling walker (2 wheeled);Crutches Transfers: Sit to/from Stand Sit to Stand: Min assist;From elevated surface         General transfer comment: cues for hand and Left leg WBS using the crutchaes and RW.  Ambulation/Gait Ambulation/Gait assistance: Min guard;Min assist Ambulation Distance (Feet): 80 Feet Assistive device: Rolling walker (2 wheeled) Gait Pattern/deviations: Step-to pattern  Ambulated x 60' with crutches amd min guard.     General Gait Details:  the patient maintained NWB, at times flexed the left knee during ambulation. Instructed in  ambulation with crutches, balance is WFL.  Stairs Stairs: Yes Stairs  assistance: Min assist (for managing IV AND vac) Stair Management: No rails;Forwards;With crutches Number of Stairs: 3 General stair comments: INSTRUCTIONS TO PATIENT AND WIFE ON crutches and steps. Patient performed well. Verbally reviewed to use a rail and a crutch when he decides to get to second level as needed.  Wheelchair Mobility    Modified Rankin (Stroke Patients Only)       Balance                                             Pertinent Vitals/Pain Pain Assessment: 0-10 Pain Score: 4  Pain Location: L ankle and heel Pain Descriptors / Indicators: Discomfort;Sore;Squeezing Pain Intervention(s): Limited activity within patient's tolerance;Monitored during session;Repositioned;Ice applied    Home Living Family/patient expects to be discharged to:: Private residence Living Arrangements: Spouse/significant other;Children Available Help at Discharge: Family Type of Home: House Home Access: Stairs to enter Entrance Stairs-Rails: None Entrance Stairs-Number of Steps: 3 Home Layout: Two level;Able to live on main level with bedroom/bathroom Home Equipment: None      Prior Function Level of Independence: Independent               Hand Dominance        Extremity/Trunk Assessment   Upper Extremity Assessment: Overall WFL for tasks assessed           Lower Extremity Assessment: LLE deficits/detail   LLE Deficits / Details: able to raise the leg from the bed. Able to flex the knee during gait  Cervical / Trunk Assessment: Normal  Communication  Communication: No difficulties  Cognition Arousal/Alertness: Awake/alert Behavior During Therapy: WFL for tasks assessed/performed Overall Cognitive Status: Within Functional Limits for tasks assessed                      General Comments      Exercises General Exercises - Lower Extremity Ankle Circles/Pumps: AROM;Left (encouraged active ROM)      Assessment/Plan    PT  Assessment Patent does not need any further PT services  PT Diagnosis Acute pain;Difficulty walking   PT Problem List    PT Treatment Interventions     PT Goals (Current goals can be found in the Care Plan section) Acute Rehab PT Goals Patient Stated Goal: to go home PT Goal Formulation: All assessment and education complete, DC therapy    Frequency     Barriers to discharge        Co-evaluation               End of Session Equipment Utilized During Treatment: Gait belt;Other (comment) (Left CAM boot) Activity Tolerance: Patient tolerated treatment well Patient left: in bed;with call bell/phone within reach;with family/visitor present Nurse Communication: Mobility status (need for crutches)    Functional Assessment Tool Used: clinical judgement Functional Limitation: Mobility: Walking and moving around Mobility: Walking and Moving Around Current Status ([id number](G8978): At least 1 percent but less than 20 percent impaired, limited or restricted Mobility: Walking and Moving Around Goal Status [phone](G8979): At least 1 percent but less than 20 percent impaired, limited or restricted Mobility: Walking and Moving Around Discharge Status [phone](G8980): At least 1 percent but less than 20 percent impaired, limited or restricted    Time: 1135-1221 PT Time Calculation (min) (ACUTE ONLY): 46 min   Charges:   PT Evaluation $PT Eval Low Complexity: 1 Procedure PT Treatments $Gait Training: 8-22 mins $Self Care/Home Management: 8-22   PT G Codes:   PT G-Codes **NOT FOR INPATIENT CLASS** Functional Assessment Tool Used: clinical judgement Functional Limitation: Mobility: Walking and moving around Mobility: Walking and Moving Around Current Status ([id number](G8978): At least 1 percent but less than 20 percent impaired, limited or restricted Mobility: Walking and Moving Around Goal Status [phone]([phone]): At least 1 percent but less than 20 percent impaired, limited or restricted Mobility: Walking and Moving Around  Discharge Status [phone](G8980): At least 1 percent but less than 20 percent impaired, limited or restricted    Rada HayHill, Ellowyn Rieves Elizabeth 01/09/2016, 1:13 PM Blanchard KelchKaren Tiras Bianchini PT [phone]

## 2016-01-09 NOTE — Progress Notes (Signed)
Patient ID: Shane Sweeney, male   DOB: [birth year], 35 y.o.   MRN: [id number] Patient alert this morning without complaints. Plan for discharge to home after physical therapy. Patient given instructions to work on range of motion of his ankle wall when bed. He is to wear the fracture boot at all times when he is up ambulating strict nonweightbearing.

## 2016-01-09 NOTE — Progress Notes (Signed)
Orthopedic Tech Progress Note Patient Details:  Jaci StandardMark Krauss 1981/03/16 [id number]  Ortho Devices Type of Ortho Device: Crutches Ortho Device/Splint Location: lle Ortho Device/Splint Interventions: Application   Saul FordyceJennifer C Unity Luepke 01/09/2016, 1:12 PM

## 2016-01-09 NOTE — Discharge Summary (Signed)
Physician Discharge Summary  Patient ID: Jaci StandardMark Selph MRN: [id number] DOB/AGE: 02/01/1981 35 y.o.  Admit date: 01/08/2016 Discharge date: 01/09/2016  Admission Diagnoses:Open grade 3 and a pilon fracture left tibia and fibula  Discharge Diagnoses:  Active Problems:   Pilon fracture of left tibia   Discharged Condition: stable  Hospital Course: Patient's hospital course was essentially unremarkable. He underwent open reduction internal fixation of the pilon fracture as well as irrigation debridement of the open fibular fracture. Postoperatively patient progressed well and was discharged to home in stable condition.  Consults: None  Significant Diagnostic Studies: labs: Routine labs  Treatments: surgery: See operative note  Discharge Exam: Blood pressure 118/64, pulse 72, temperature 98.8 F (37.1 C), temperature source Oral, resp. rate 14, height 5\' 10"  (1.778 m), weight 74.39 kg (164 lb), SpO2 99 %. Incision/Wound: Wound VAC functioning well  Disposition: Final discharge disposition not confirmed     Medication List    Notice    You have not been prescribed any medications.         Follow-up Information    Follow up with DUDA,MARCUS V, MD In 2 weeks.   Specialty:  Orthopedic Surgery   Contact information:   [address] [place] [address] [phone]       Signed: Nadara MustardDUDA,MARCUS V 01/09/2016, 6:56 AM

## 2016-01-09 NOTE — Progress Notes (Signed)
Patient has a temperature of 100.7. Tylenol was given. Patients temperature will be retaken in 45 mins.

## 2016-01-09 NOTE — Progress Notes (Signed)
Orthopedic Tech Progress Note Patient Details:  Jaci StandardMark Gonia Apr 12, 1981 [id number]  Ortho Devices Type of Ortho Device: CAM walker Ortho Device/Splint Location: lle Ortho Device/Splint Interventions: Ordered, Application   Trinna PostMartinez, Amore Grater J 01/09/2016, 5:47 AM

## 2016-01-09 NOTE — Progress Notes (Signed)
Patient educated on the proper way to use incentive spirometer. Patient demonstrated how to use incentive spirometer. Patient has no questions at this time about [employer] use.
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
## 2020-07-02 ENCOUNTER — Emergency Department: Admit: 2020-07-03 | Payer: BLUE CROSS/BLUE SHIELD

## 2020-07-02 DIAGNOSIS — S060X9A Concussion with loss of consciousness of unspecified duration, initial encounter: Secondary | ICD-10-CM
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
## 2020-07-02 NOTE — ED Notes (Signed)
 I have reviewed discharge instructions with the patient and spouse.  The patient and spouse verbalized understanding.    Patient left ED via Discharge Method: ambulatory to Home with spouse    Opportunity for questions and clarification provided.       Patient given 0 scripts.         To continue your aftercare when you leave the hospital, you may receive an automated call from our care team to check in on how you are doing.  This is a free service and part of our promise to provide the best care and service to meet your aftercare needs." If you have questions, or wish to unsubscribe from this service please call [phone].  Thank you for Choosing our [facility] Emergency Department.

## 2020-07-03 ENCOUNTER — Inpatient Hospital Stay
Admit: 2020-07-03 | Discharge: 2020-07-03 | Disposition: A | Discharge status: Home / Self Care | Payer: BLUE CROSS/BLUE SHIELD | Attending: Emergency Medicine
